# Patient Record
Sex: Female | Born: 1945 | Race: White | Hispanic: No | Smoking: Current every day smoker
Health system: Southern US, Community
[De-identification: ages and names within clinical notes are randomized; demographics above are authoritative.]

## PROBLEM LIST (undated history)

## (undated) DIAGNOSIS — I8390 Asymptomatic varicose veins of unspecified lower extremity: Secondary | ICD-10-CM

## (undated) DIAGNOSIS — C801 Malignant (primary) neoplasm, unspecified: Secondary | ICD-10-CM

## (undated) DIAGNOSIS — M810 Age-related osteoporosis without current pathological fracture: Secondary | ICD-10-CM

## (undated) HISTORY — PX: ENDOSCOPIC LIGATION SAPHENOUS VEIN: SUR441

## (undated) HISTORY — PX: CATARACT EXTRACTION W/ INTRAOCULAR LENS  IMPLANT, BILATERAL: SHX1307

## (undated) HISTORY — PX: DILATION AND CURETTAGE OF UTERUS: SHX78

## (undated) HISTORY — PX: BASAL CELL CARCINOMA EXCISION: SHX1214

## (undated) HISTORY — PX: ABDOMINAL HYSTERECTOMY: SHX81

## (undated) HISTORY — PX: TUBAL LIGATION: SHX77

## (undated) HISTORY — PX: CHOLECYSTECTOMY: SHX55

---

## 2010-05-31 ENCOUNTER — Ambulatory Visit: Payer: Self-pay | Admitting: Ophthalmology

## 2012-02-27 ENCOUNTER — Ambulatory Visit: Payer: Self-pay | Admitting: Family Medicine

## 2015-01-03 ENCOUNTER — Encounter: Payer: Self-pay | Admitting: *Deleted

## 2015-01-05 ENCOUNTER — Encounter: Payer: Self-pay | Admitting: *Deleted

## 2015-07-18 ENCOUNTER — Encounter: Payer: Self-pay | Admitting: *Deleted

## 2015-07-21 ENCOUNTER — Ambulatory Visit: Payer: Medicare Other | Admitting: Anesthesiology

## 2015-07-21 ENCOUNTER — Ambulatory Visit
Admission: RE | Admit: 2015-07-21 | Discharge: 2015-07-21 | Disposition: A | Discharge status: Home / Self Care | Payer: Medicare Other | Source: Ambulatory Visit | Attending: Surgery | Admitting: Surgery

## 2015-07-21 ENCOUNTER — Encounter
Admission: RE | Disposition: A | Discharge status: Home / Self Care | Payer: Self-pay | Source: Ambulatory Visit | Attending: Surgery

## 2015-07-21 DIAGNOSIS — Z8379 Family history of other diseases of the digestive system: Secondary | ICD-10-CM | POA: Diagnosis not present

## 2015-07-21 DIAGNOSIS — Z85828 Personal history of other malignant neoplasm of skin: Secondary | ICD-10-CM | POA: Diagnosis not present

## 2015-07-21 DIAGNOSIS — M810 Age-related osteoporosis without current pathological fracture: Secondary | ICD-10-CM | POA: Insufficient documentation

## 2015-07-21 DIAGNOSIS — Z9049 Acquired absence of other specified parts of digestive tract: Secondary | ICD-10-CM | POA: Insufficient documentation

## 2015-07-21 DIAGNOSIS — Z90710 Acquired absence of both cervix and uterus: Secondary | ICD-10-CM | POA: Insufficient documentation

## 2015-07-21 DIAGNOSIS — F17200 Nicotine dependence, unspecified, uncomplicated: Secondary | ICD-10-CM | POA: Insufficient documentation

## 2015-07-21 DIAGNOSIS — Z8249 Family history of ischemic heart disease and other diseases of the circulatory system: Secondary | ICD-10-CM | POA: Insufficient documentation

## 2015-07-21 DIAGNOSIS — Z9841 Cataract extraction status, right eye: Secondary | ICD-10-CM | POA: Diagnosis not present

## 2015-07-21 DIAGNOSIS — M65342 Trigger finger, left ring finger: Secondary | ICD-10-CM | POA: Diagnosis not present

## 2015-07-21 HISTORY — PX: TRIGGER FINGER RELEASE: SHX641

## 2015-07-21 HISTORY — DX: Asymptomatic varicose veins of unspecified lower extremity: I83.90

## 2015-07-21 SURGERY — RELEASE, A1 PULLEY, FOR TRIGGER FINGER
Anesthesia: Monitor Anesthesia Care | Laterality: Left | Wound class: Clean

## 2015-07-21 MED ORDER — LACTATED RINGERS IV SOLN
INTRAVENOUS | Status: DC
  Administered 2015-07-21: 08:00:00 via INTRAVENOUS

## 2015-07-21 MED ORDER — MIDAZOLAM HCL 5 MG/5ML IJ SOLN
INTRAMUSCULAR | Status: DC | PRN
  Administered 2015-07-21 (×2): 1 mg via INTRAVENOUS

## 2015-07-21 MED ORDER — PROPOFOL 500 MG/50ML IV EMUL
INTRAVENOUS | Status: DC | PRN
  Administered 2015-07-21: 75 ug/kg/min via INTRAVENOUS

## 2015-07-21 MED ORDER — BUPIVACAINE HCL (PF) 0.5 % IJ SOLN
INTRAMUSCULAR | Status: DC | PRN
Start: 2015-07-21 — End: 2015-07-21
  Administered 2015-07-21: 5 mL

## 2015-07-21 MED ORDER — FENTANYL CITRATE (PF) 100 MCG/2ML IJ SOLN
25.0000 ug | INTRAMUSCULAR | Status: DC | PRN
  Administered 2015-07-21: 50 ug via INTRAVENOUS

## 2015-07-21 MED ORDER — CEFAZOLIN SODIUM-DEXTROSE 2-3 GM-% IV SOLR
2.0000 g | Freq: Once | INTRAVENOUS | Status: AC
  Administered 2015-07-21: 2 g via INTRAVENOUS

## 2015-07-21 MED ORDER — HYDROCODONE-ACETAMINOPHEN 5-325 MG PO TABS: 1.0000 | ORAL_TABLET | Freq: Four times a day (QID) | ORAL | Status: DC | PRN

## 2015-07-21 MED ORDER — ONDANSETRON HCL 4 MG/2ML IJ SOLN
4.0000 mg | Freq: Once | INTRAMUSCULAR | Status: AC | PRN
  Administered 2015-07-21: 4 mg via INTRAVENOUS

## 2015-07-21 SURGICAL SUPPLY — 22 items
BANDAGE ELASTIC 2 VELCRO NS LF (GAUZE/BANDAGES/DRESSINGS) IMPLANT
BNDG ESMARK 4X12 TAN STRL LF (GAUZE/BANDAGES/DRESSINGS) ×2 IMPLANT
CHLORAPREP W/TINT 26ML (MISCELLANEOUS) ×2 IMPLANT
CORD BIP STRL DISP 12FT (MISCELLANEOUS) IMPLANT
COVER LIGHT HANDLE FLEXIBLE (MISCELLANEOUS) ×2 IMPLANT
CUFF TOURNIQUET DUAL PORT 18X3 (MISCELLANEOUS) ×4 IMPLANT
DECANTER SPIKE VIAL GLASS SM (MISCELLANEOUS) ×2 IMPLANT
GAUZE PETRO XEROFOAM 1X8 (MISCELLANEOUS) ×2 IMPLANT
GAUZE SPONGE 4X4 12PLY STRL (GAUZE/BANDAGES/DRESSINGS) ×2 IMPLANT
GLOVE BIO SURGEON STRL SZ8 (GLOVE) ×4 IMPLANT
GLOVE INDICATOR 8.0 STRL GRN (GLOVE) ×2 IMPLANT
GOWN STRL REUS W/ TWL LRG LVL3 (GOWN DISPOSABLE) ×1 IMPLANT
GOWN STRL REUS W/ TWL XL LVL3 (GOWN DISPOSABLE) ×1 IMPLANT
GOWN STRL REUS W/TWL LRG LVL3 (GOWN DISPOSABLE) ×1
GOWN STRL REUS W/TWL XL LVL3 (GOWN DISPOSABLE) ×1
NS IRRIG 500ML POUR BTL (IV SOLUTION) ×2 IMPLANT
PACK EXTREMITY ARMC (MISCELLANEOUS) ×2 IMPLANT
PAD GROUND ADULT SPLIT (MISCELLANEOUS) ×2 IMPLANT
STRAP BODY AND KNEE 60X3 (MISCELLANEOUS) ×2 IMPLANT
SUT PROLENE 4 0 PS 2 18 (SUTURE) ×2 IMPLANT
SUT VIC AB 3-0 SH 27 (SUTURE) ×1
SUT VIC AB 3-0 SH 27X BRD (SUTURE) ×1 IMPLANT

## 2015-07-21 NOTE — Transfer of Care (Signed)
Immediate Anesthesia Transfer of Care Note  Patient: Christine Fleming  Procedure(s) Performed: Procedure(s): RELEASE TRIGGER FINGER/A-1 PULLEY (Left)  Patient Location: PACU  Anesthesia Type: Bier Block, MAC  Level of Consciousness: awake, alert  and patient cooperative  Airway and Oxygen Therapy: Patient Spontanous Breathing and Patient connected to supplemental oxygen  Post-op Assessment: Post-op Vital signs reviewed, Patient's Cardiovascular Status Stable, Respiratory Function Stable, Patent Airway and No signs of Nausea or vomiting  Post-op Vital Signs: Reviewed and stable  Complications: No apparent anesthesia complications

## 2015-07-21 NOTE — Anesthesia Preprocedure Evaluation (Signed)
Anesthesia Evaluation  Patient identified by MRN, date of birth, ID band  Reviewed: Allergy & Precautions, H&P , NPO status , Patient's Chart, lab work & pertinent test results  Airway Mallampati: II  TM Distance: >3 FB Neck ROM: full    Dental no notable dental hx.    Pulmonary Current Smoker,    Pulmonary exam normal        Cardiovascular  Rhythm:regular Rate:Normal     Neuro/Psych    GI/Hepatic   Endo/Other    Renal/GU      Musculoskeletal   Abdominal   Peds  Hematology   Anesthesia Other Findings   Reproductive/Obstetrics                             Anesthesia Physical Anesthesia Plan  ASA: I  Anesthesia Plan: Bier Block and MAC   Post-op Pain Management:    Induction:   Airway Management Planned:   Additional Equipment:   Intra-op Plan:   Post-operative Plan:   Informed Consent: I have reviewed the patients History and Physical, chart, labs and discussed the procedure including the risks, benefits and alternatives for the proposed anesthesia with the patient or authorized representative who has indicated his/her understanding and acceptance.     Plan Discussed with: CRNA  Anesthesia Plan Comments:         Anesthesia Quick Evaluation

## 2015-07-21 NOTE — Op Note (Signed)
07/21/2015  8:13 AM  Patient:   Christine Fleming  Pre-Op Diagnosis:   Left ring trigger finger.  Post-Op Diagnosis:   Same  Procedure:   Release left ring trigger finger.  Surgeon:   Pascal Lux, MD  Assistant:   None  Anesthesia:   Bier block  Findings:   As above.  Complications:   None  EBL:   1 cc  Fluids:   450 cc crystalloid  TT:   27 minutes at 250 mmHg  Drains:   None  Closure:   4-0 Prolene  Implants:   None  Brief Clinical Note:   The patient is a 69 year old female with a several month history of progressively worsening painful catching of her left ring finger. These symptoms have progressed despite medications, activity modification, etc. The patient's history and examination were consistent with a left ring trigger finger. She presents this time for a left ring trigger finger release.  Procedure:   The patient was brought into the operating room and lain in the supine position. After adequate IV sedation was achieved, a timeout was performed to verify the appropriate surgical site. A Bier block was placed by the anesthesiologist and the tourniquet inflated to 250 mmHg. The left hand and upper extremity were prepped with ChloraPrep solution before being draped sterilely. Preoperative antibiotics were administered. An approximate 1.5 cm incision was made over the volar aspect of the left ring finger at the distal palmar crease centered over the flexor sheath. The incision was carried down through the subcutaneous tissues with care taken to identify and protect the digital neurovascular structures. The flexor sheath was entered just proximal to the A1 pulley. The sheath was released proximally for several centimeters under direct visualization. Distally, a clamp was placed beneath the A1 pulley and used to release any adhesions. The clamp was repositioned so that one jaw was superficial to and the other jaw deep to the A1 pulley. The A1 pulley was incised on  either side of the clamp to remove a 2 mm strip of tissue. Metzenbaum scissors was used to ensure complete release of the A1 pulley more distally. The underlying tendons were carefully inspected and found to be intact. The wound was copiously irrigated with sterile saline solution before the wound was closed using 4-0 proline interrupted sutures. A total of 5 cc of 0.5% plain Sensorcaine was injected in and around the incision to help with postoperative analgesia before a sterile bulky dressing was applied to the hand. The patient was then awakened and returned to the recovery room in satisfactory condition after tolerating the procedure well.

## 2015-07-21 NOTE — Discharge Instructions (Signed)
General Anesthesia, Adult, Care After Refer to this sheet in the next few weeks. These instructions provide you with information on caring for yourself after your procedure. Your health care provider may also give you more specific instructions. Your treatment has been planned according to current medical practices, but problems sometimes occur. Call your health care provider if you have any problems or questions after your procedure. WHAT TO EXPECT AFTER THE PROCEDURE After the procedure, it is typical to experience:  Sleepiness.  Nausea and vomiting. HOME CARE INSTRUCTIONS  For the first 24 hours after general anesthesia:  Have a responsible person with you.  Do not drive a car. If you are alone, do not take public transportation.  Do not drink alcohol.  Do not take medicine that has not been prescribed by your health care provider.  Do not sign important papers or make important decisions.  You may resume a normal diet and activities as directed by your health care provider.  Change bandages (dressings) as directed.  If you have questions or problems that seem related to general anesthesia, call the hospital and ask for the anesthetist or anesthesiologist on call. SEEK MEDICAL CARE IF:  You have nausea and vomiting that continue the day after anesthesia.  You develop a rash. SEEK IMMEDIATE MEDICAL CARE IF:   You have difficulty breathing.  You have chest pain.  You have any allergic problems.   This information is not intended to replace advice given to you by your health care provider. Make sure you discuss any questions you have with your health care provider.   Document Released: 12/17/2000 Document Revised: 10/01/2014 Document Reviewed: 01/09/2012 Elsevier Interactive Patient Education 2016 [employer].  Keep dressing dry and intact. May shower once dressing changed on Monday morning.  Cover stitches with Band-Aid. Keep hand elevated above heart level. Apply  ice to affected area frequently. Return for follow-up in 10-14 days or as scheduled.

## 2015-07-21 NOTE — H&P (Signed)
Paper H&P to be scanned into permanent record. H&P reviewed. No changes. 

## 2015-07-21 NOTE — Anesthesia Procedure Notes (Addendum)
Procedure Name: MAC Performed by: Mayme Genta Pre-anesthesia Checklist: Patient identified, Emergency Drugs available, Suction available, Timeout performed and Patient being monitored Patient Re-evaluated:Patient Re-evaluated prior to inductionOxygen Delivery Method: Nasal cannula Placement Confirmation: positive ETCO2   Anesthesia Regional Block:  Bier block (IV Regional)  Pre-Anesthetic Checklist: ,, timeout performed, Correct Patient, Correct Site, Correct Laterality, Correct Procedure, Correct Position, site marked, Risks and benefits discussed,  Surgical consent,  Pre-op evaluation,  At surgeon's request and post-op pain management  Laterality: Left  Prep: alcohol swabs       Bier block (IV Regional) Narrative:  Start time: 07/21/2015 7:41 AM End time: 07/21/2015 7:42 AM Injection made incrementally with aspirations every 5 mL.  Performed by: With CRNAs  Anesthesiologist: Ronelle Nigh  Additional Notes: 53ml 0.5% plain Lidocaine injected by Dr. Junious Dresser without difficulty.

## 2015-07-21 NOTE — Anesthesia Postprocedure Evaluation (Signed)
  Anesthesia Post-op Note  Patient: Christine Fleming  Procedure(s) Performed: Procedure(s): RELEASE TRIGGER FINGER/A-1 PULLEY (Left)  Anesthesia type:Bier Block, MAC  Patient location: PACU  Post pain: Pain level controlled  Post assessment: Post-op Vital signs reviewed, Patient's Cardiovascular Status Stable, Respiratory Function Stable, Patent Airway and No signs of Nausea or vomiting  Post vital signs: Reviewed and stable  Last Vitals:  Filed Vitals:   07/21/15 0838  BP:   Pulse: 66  Temp:   Resp: 13    Level of consciousness: awake, alert  and patient cooperative  Complications: No apparent anesthesia complications

## 2015-07-22 ENCOUNTER — Encounter: Payer: Self-pay | Admitting: Surgery

## 2016-01-02 ENCOUNTER — Other Ambulatory Visit: Payer: Self-pay | Admitting: Family Medicine

## 2016-01-02 DIAGNOSIS — M810 Age-related osteoporosis without current pathological fracture: Secondary | ICD-10-CM

## 2016-01-02 DIAGNOSIS — Z1231 Encounter for screening mammogram for malignant neoplasm of breast: Secondary | ICD-10-CM

## 2016-01-10 ENCOUNTER — Ambulatory Visit
Admission: RE | Admit: 2016-01-10 | Discharge: 2016-01-10 | Disposition: A | Discharge status: Home / Self Care | Payer: Medicare Other | Source: Ambulatory Visit | Attending: Family Medicine | Admitting: Family Medicine

## 2016-01-10 DIAGNOSIS — Z1231 Encounter for screening mammogram for malignant neoplasm of breast: Secondary | ICD-10-CM | POA: Diagnosis not present

## 2016-01-10 DIAGNOSIS — Z780 Asymptomatic menopausal state: Secondary | ICD-10-CM | POA: Insufficient documentation

## 2016-01-10 DIAGNOSIS — M810 Age-related osteoporosis without current pathological fracture: Secondary | ICD-10-CM

## 2016-01-10 DIAGNOSIS — Z720 Tobacco use: Secondary | ICD-10-CM | POA: Diagnosis not present

## 2016-01-10 HISTORY — DX: Malignant (primary) neoplasm, unspecified: C80.1

## 2016-03-23 ENCOUNTER — Other Ambulatory Visit: Payer: Self-pay | Admitting: Gastroenterology

## 2016-03-23 MED ORDER — MECLIZINE HCL 32 MG PO TABS: 32.0000 mg | ORAL_TABLET | Freq: Three times a day (TID) | ORAL | Status: DC | PRN

## 2017-09-05 ENCOUNTER — Other Ambulatory Visit: Payer: Self-pay | Admitting: Family Medicine

## 2017-11-15 ENCOUNTER — Encounter: Payer: Self-pay | Admitting: Emergency Medicine

## 2017-11-15 ENCOUNTER — Ambulatory Visit
Admission: EM | Admit: 2017-11-15 | Discharge: 2017-11-15 | Disposition: A | Discharge status: Home / Self Care | Payer: Medicare Other | Attending: Family Medicine | Admitting: Family Medicine

## 2017-11-15 ENCOUNTER — Other Ambulatory Visit: Payer: Self-pay

## 2017-11-15 DIAGNOSIS — R509 Fever, unspecified: Secondary | ICD-10-CM

## 2017-11-15 DIAGNOSIS — J988 Other specified respiratory disorders: Secondary | ICD-10-CM

## 2017-11-15 DIAGNOSIS — R05 Cough: Secondary | ICD-10-CM

## 2017-11-15 HISTORY — DX: Age-related osteoporosis without current pathological fracture: M81.0

## 2017-11-15 MED ORDER — DOXYCYCLINE HYCLATE 100 MG PO CAPS: 100.0000 mg | ORAL_CAPSULE | Freq: Two times a day (BID) | ORAL | 0 refills | Status: AC

## 2017-11-15 NOTE — ED Triage Notes (Signed)
Patient in today c/o cough, body aches, fever (100) and headache x 1 week. Patient states one day she will feel better and then the next day she will feel bad again. Patient has tried OTC Mucinex and a sinus decongestant.

## 2017-11-15 NOTE — Discharge Instructions (Signed)
Rest, fluids. ° °Antibiotic as prescribed. ° °Take care ° °Dr. Ailsa Mireles  °

## 2017-11-15 NOTE — ED Provider Notes (Signed)
[facility]    CSN: [id number] Arrival date & time: 11/15/17  4098  History   Chief Complaint Chief Complaint  Patient presents with  . Cough    APPT   HPI  72 year old female presents with the above complaint.  Patient she has been sick since this past weekend.  She states that she initially had sinus pain and pressure as well as fever and body aches.  Fever resolved and then recurred again recently.  She continues to have sinus pain and pressure.  Mild body aches.  She reports that now she has had a dry cough as well.  No known exacerbating relieving factors.  Symptoms are severe and worsening.  No other associated symptoms.  No other complaints at this time.  Past Medical History:  Diagnosis Date  . Cancer ([place])    basal cell  . Osteoporosis   . Varicose veins    Past Surgical History:  Procedure Laterality Date  . ABDOMINAL HYSTERECTOMY    . BASAL CELL CARCINOMA EXCISION Left    leg  . CATARACT EXTRACTION W/ INTRAOCULAR LENS  IMPLANT, BILATERAL    . CHOLECYSTECTOMY    . DILATION AND CURETTAGE OF UTERUS    . ENDOSCOPIC LIGATION SAPHENOUS VEIN Left   . TRIGGER FINGER RELEASE Left 07/21/2015   Procedure: RELEASE TRIGGER FINGER/A-1 PULLEY;  Surgeon: Corky Mull, MD;  Location: [facility];  Service: Orthopedics;  Laterality: Left;  . TUBAL LIGATION      OB History    No data available       Home Medications    Prior to Admission medications   Medication Sig Start Date End Date Taking? Authorizing Provider  alendronate (FOSAMAX) 70 MG tablet Take 70 mg by mouth once a week. Take with a full glass of water on an empty stomach.   Yes [provider]  Triamcinolone Acetonide (NASACORT ALLERGY 24HR NA) Place 1 spray into the nose daily.   Yes [provider]  doxycycline (VIBRAMYCIN) 100 MG capsule Take 1 capsule (100 mg total) by mouth 2 (two) times daily. 11/15/17   Coral Spikes, DO    Family History Family History  Problem  Relation Age of Onset  . Stroke Mother   . Gallstones Father     Social History Social History   Tobacco Use  . Smoking status: Current Every Day Smoker    Packs/day: 0.50    Years: 40.00    Pack years: 20.00  . Smokeless tobacco: Never Used  Substance Use Topics  . Alcohol use: Yes    Alcohol/week: 2.4 oz    Types: 4 Cans of beer per week  . Drug use: Not on file     Allergies   Patient has no known allergies.   Review of Systems Review of Systems  Constitutional: Positive for fever.  HENT: Positive for sinus pressure and sinus pain.   Respiratory: Positive for cough.   Musculoskeletal:       Body aches.   Physical Exam Triage Vital Signs ED Triage Vitals  Enc Vitals Group     BP 11/15/17 1003 122/68     Pulse --      Resp 11/15/17 1003 16     Temp 11/15/17 1003 97.7 F (36.5 C)     Temp Source 11/15/17 1003 Oral     SpO2 11/15/17 1003 100 %     Weight 11/15/17 1004 124 lb (56.2 kg)     Height 11/15/17 1004 5\' 1"  (  1.549 m)     Head Circumference --      Peak Flow --      Pain Score 11/15/17 1004 0     Pain Loc --      Pain Edu? --      Excl. in [place]? --    Updated Vital Signs BP 122/68 (BP Location: Left Arm)   Temp 97.7 F (36.5 C) (Oral)   Resp 16   Ht 5\' 1"  (1.549 m)   Wt 124 lb (56.2 kg)   SpO2 100%   BMI 23.43 kg/m    Physical Exam  Constitutional: She is oriented to person, place, and time. She appears well-developed. No distress.  HENT:  Head: Normocephalic and atraumatic.  Mouth/Throat: Oropharynx is clear and moist.  Eyes: Conjunctivae are normal. Right eye exhibits no discharge. Left eye exhibits no discharge.  Neck: Neck supple.  Cardiovascular:  Tachycardic. Regular rhythm.  Pulmonary/Chest: Effort normal and breath sounds normal. She has no wheezes. She has no rales.  Lymphadenopathy:    She has no cervical adenopathy.  Neurological: She is alert and oriented to person, place, and time.  Psychiatric: She has a normal mood and  affect. Her behavior is normal.  Nursing note and vitals reviewed.  UC Treatments / Results  Labs (all labs ordered are listed, but only abnormal results are displayed) Labs Reviewed - No data to display  EKG  EKG Interpretation None       Radiology No results found.  Procedures Procedures (including critical care time)  Medications Ordered in UC Medications - No data to display   Initial Impression / Assessment and Plan / UC Course  I have reviewed the triage vital signs and the nursing notes.  Pertinent labs & imaging results that were available during my care of the patient were reviewed by me and considered in my medical decision making (see chart for details).     72 year old female presents with an ongoing respiratory infection.  Concern for recent influenza given symptoms.  However, she is out of the treatment window.  Concern for worsening and double sickening.  As a result, I am will treat with doxycycline.  Final Clinical Impressions(s) / UC Diagnoses   Final diagnoses:  Respiratory infection    ED Discharge Orders        Ordered    doxycycline (VIBRAMYCIN) 100 MG capsule  2 times daily     11/15/17 1117     Controlled Substance Prescriptions [place] Controlled Substance Registry consulted? Not Applicable   Coral Spikes, DO 11/15/17 1154

## 2018-03-24 ENCOUNTER — Other Ambulatory Visit: Payer: Self-pay | Admitting: Family Medicine

## 2018-03-24 DIAGNOSIS — M810 Age-related osteoporosis without current pathological fracture: Secondary | ICD-10-CM

## 2018-03-24 DIAGNOSIS — Z1239 Encounter for other screening for malignant neoplasm of breast: Secondary | ICD-10-CM

## 2018-04-07 ENCOUNTER — Ambulatory Visit
Admission: RE | Admit: 2018-04-07 | Discharge: 2018-04-07 | Disposition: A | Discharge status: Home / Self Care | Payer: Medicare Other | Source: Ambulatory Visit | Attending: Family Medicine | Admitting: Family Medicine

## 2018-04-07 DIAGNOSIS — Z1231 Encounter for screening mammogram for malignant neoplasm of breast: Secondary | ICD-10-CM | POA: Diagnosis present

## 2018-04-07 DIAGNOSIS — Z1239 Encounter for other screening for malignant neoplasm of breast: Secondary | ICD-10-CM

## 2018-04-07 DIAGNOSIS — M810 Age-related osteoporosis without current pathological fracture: Secondary | ICD-10-CM | POA: Diagnosis not present

## 2019-02-04 IMAGING — MG MM DIGITAL SCREENING BILAT W/ TOMO W/ CAD
8 series · 9 of 24 positions shown · non-contrast
Comparison: Previous exam(s).

CLINICAL DATA: Screening.

EXAM:
DIGITAL SCREENING BILATERAL MAMMOGRAM WITH TOMO AND CAD

[L MLO synth-2D]
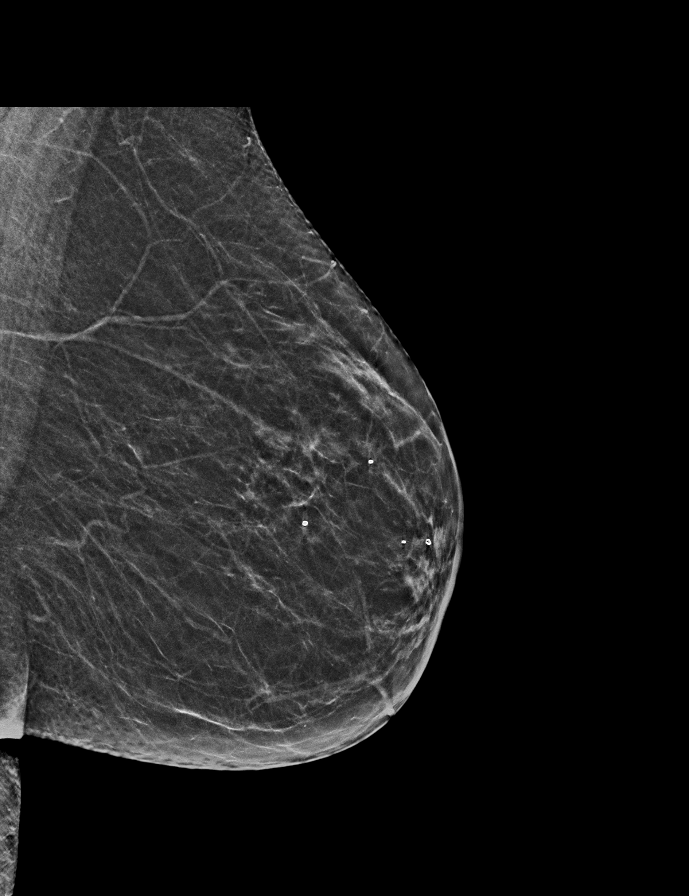

[R MLO synth-2D]
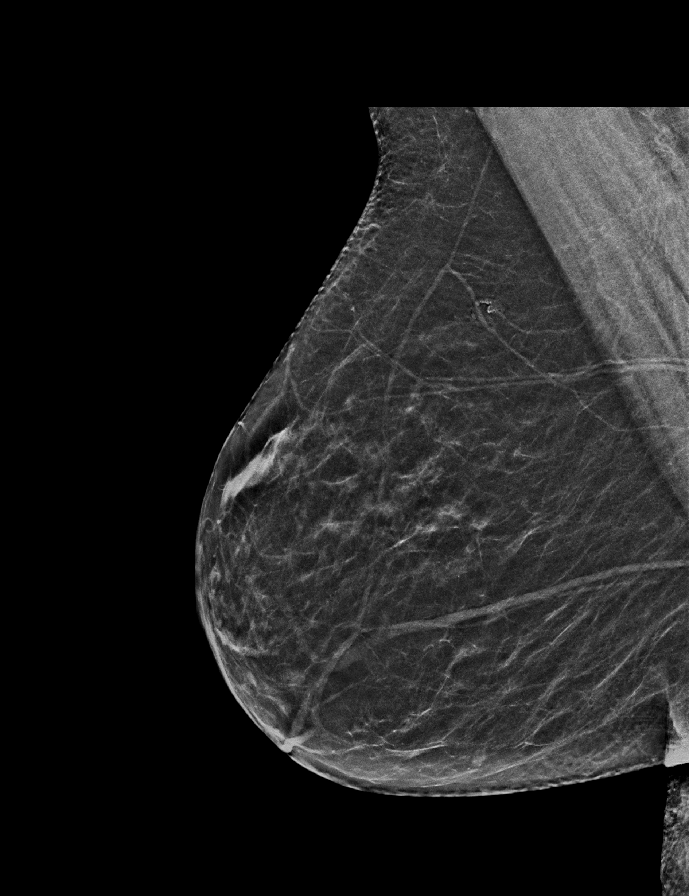

[R CC synth-2D]
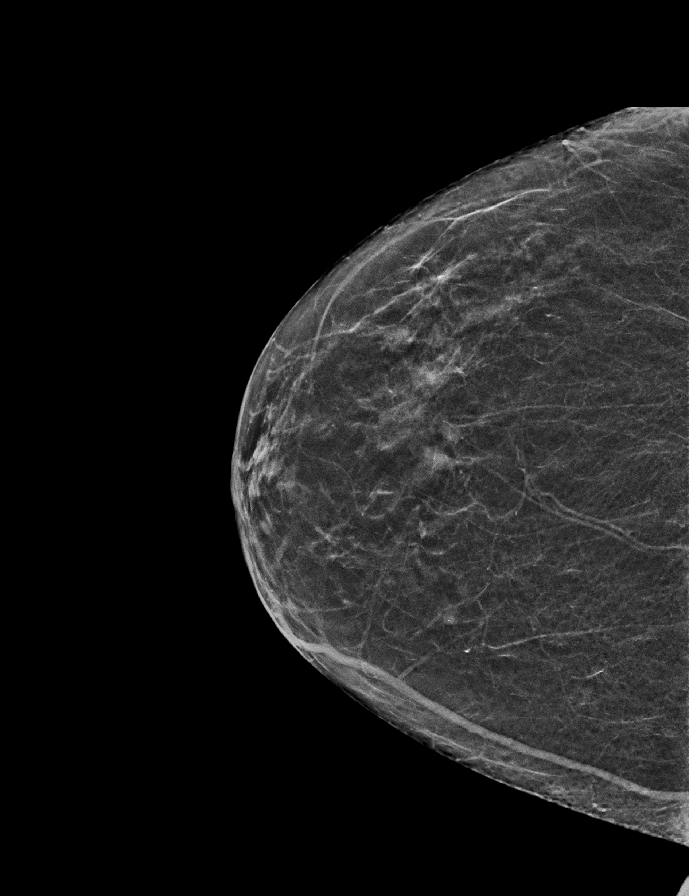

[L CC synth-2D]
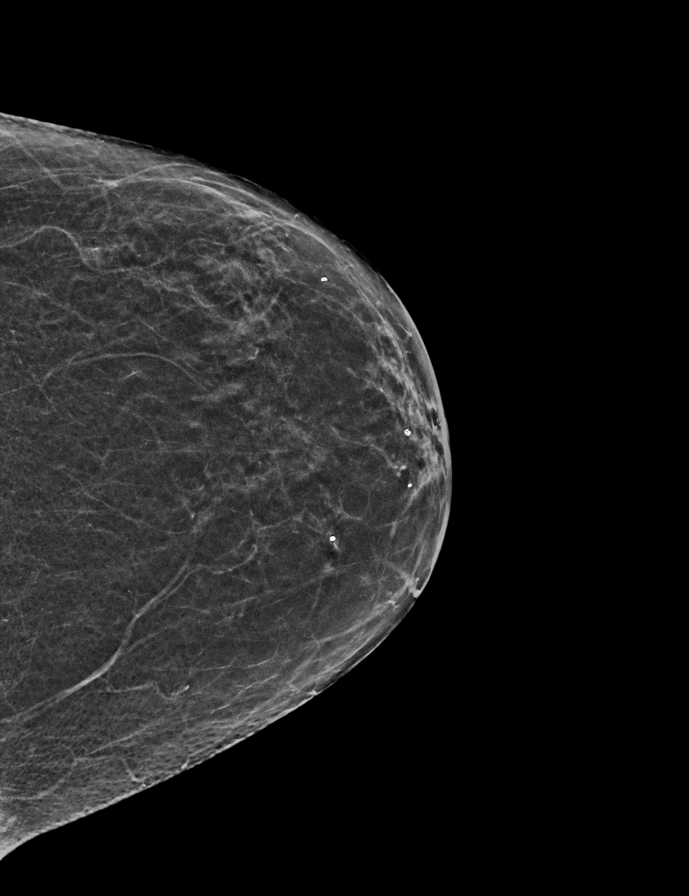

[L CC tomo · 2 of 47 frames shown]
[frame 16/47]
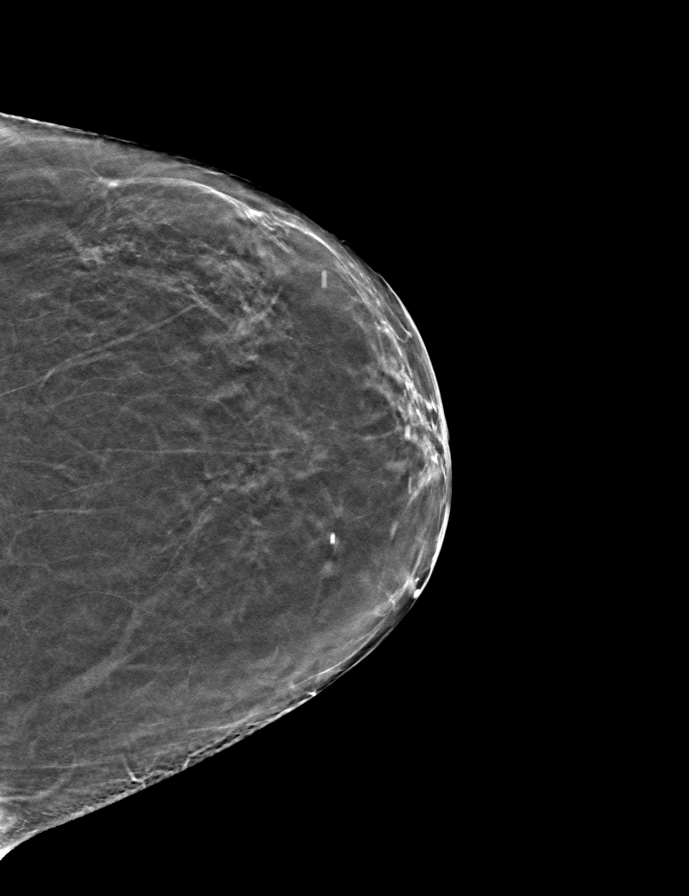
[frame 24/47]
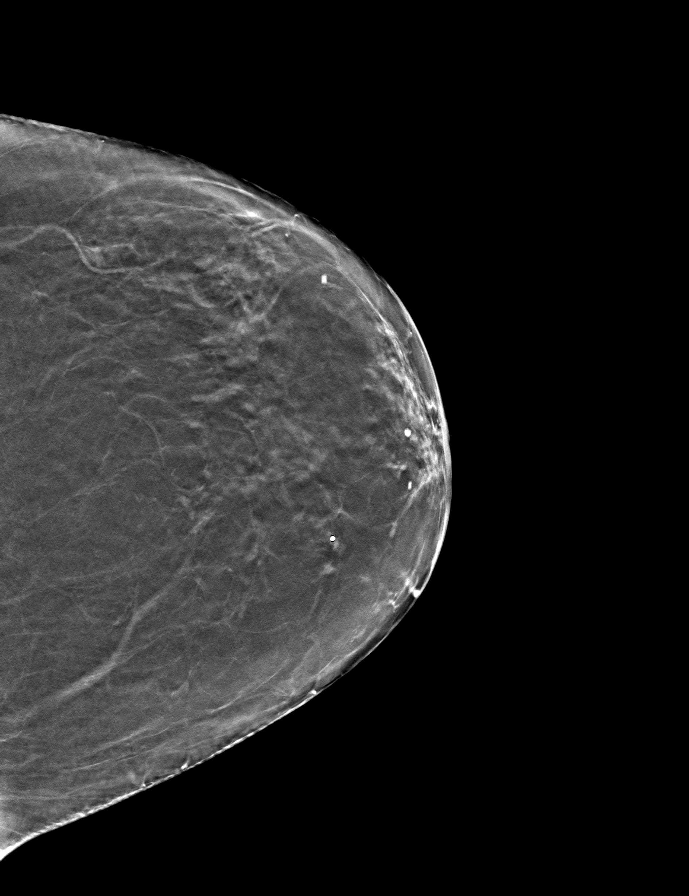

[R MLO tomo · tomo slice 26/51.0]
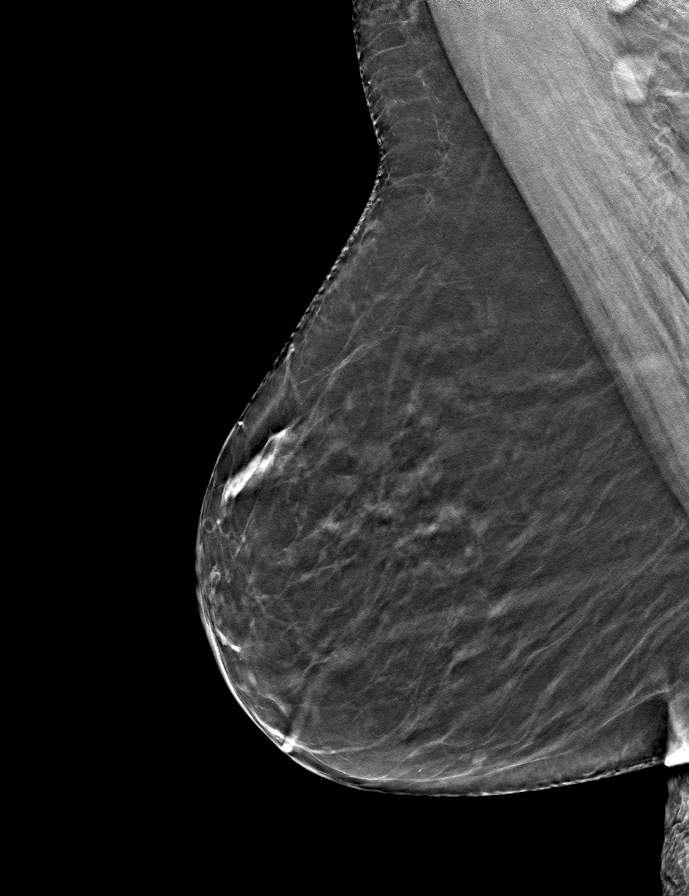

[R CC tomo · tomo slice 23/45.0]
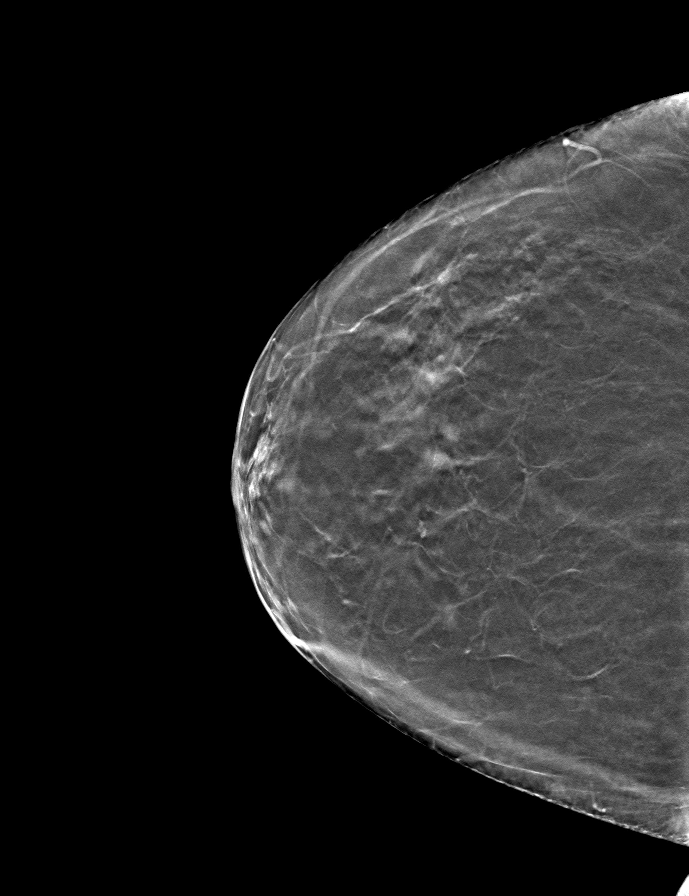

[L MLO tomo · tomo slice 25/49.0]
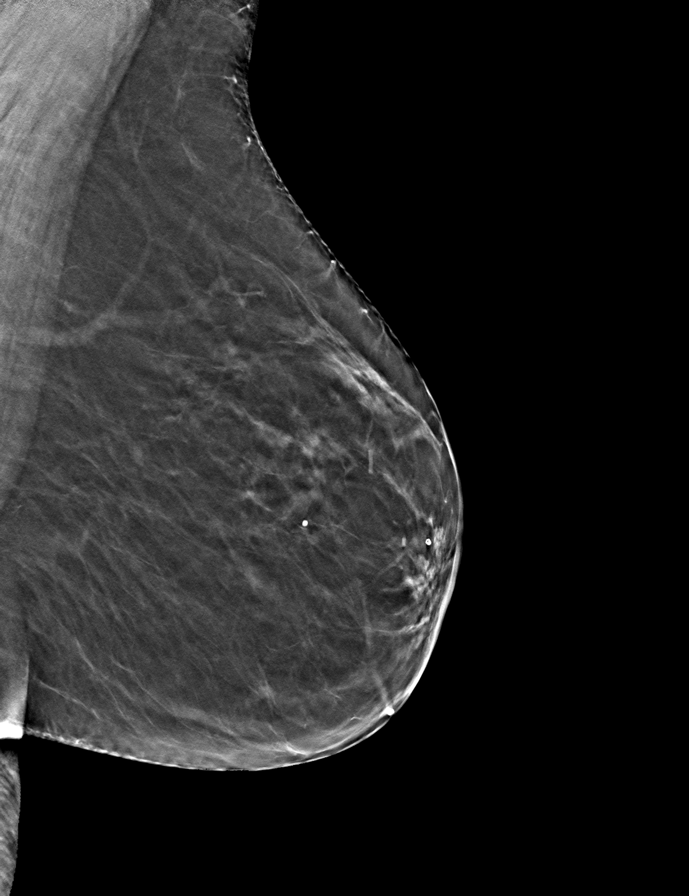

[9 of 24 positions shown; findings below may reference images not displayed]

ACR Breast Density Category b: There are scattered areas of
fibroglandular density.
FINDINGS: There are no findings suspicious for malignancy. Images were
processed with CAD.
IMPRESSION: No mammographic evidence of malignancy. A result letter of this
screening mammogram will be mailed directly to the patient.

RECOMMENDATION:
Screening mammogram in one year. (Code:[id number])

BI-RADS CATEGORY  1: Negative.

## 2019-07-15 ENCOUNTER — Other Ambulatory Visit: Payer: Self-pay | Admitting: Family Medicine

## 2019-07-15 DIAGNOSIS — Z1231 Encounter for screening mammogram for malignant neoplasm of breast: Secondary | ICD-10-CM

## 2019-09-28 ENCOUNTER — Other Ambulatory Visit: Payer: Self-pay

## 2019-09-28 ENCOUNTER — Ambulatory Visit
Admission: RE | Admit: 2019-09-28 | Discharge: 2019-09-28 | Disposition: A | Discharge status: Home / Self Care | Payer: Medicare Other | Source: Ambulatory Visit | Attending: Family Medicine | Admitting: Family Medicine

## 2019-09-28 DIAGNOSIS — Z1231 Encounter for screening mammogram for malignant neoplasm of breast: Secondary | ICD-10-CM | POA: Insufficient documentation

## 2020-08-03 ENCOUNTER — Other Ambulatory Visit: Payer: Self-pay | Admitting: Family Medicine

## 2020-08-03 DIAGNOSIS — Z1231 Encounter for screening mammogram for malignant neoplasm of breast: Secondary | ICD-10-CM

## 2020-08-03 DIAGNOSIS — M810 Age-related osteoporosis without current pathological fracture: Secondary | ICD-10-CM

## 2020-09-28 ENCOUNTER — Ambulatory Visit
Admission: RE | Admit: 2020-09-28 | Discharge: 2020-09-28 | Disposition: A | Discharge status: Home / Self Care | Payer: Medicare Other | Source: Ambulatory Visit | Attending: Family Medicine | Admitting: Family Medicine

## 2020-09-28 ENCOUNTER — Other Ambulatory Visit: Payer: Self-pay

## 2020-09-28 DIAGNOSIS — Z1231 Encounter for screening mammogram for malignant neoplasm of breast: Secondary | ICD-10-CM | POA: Insufficient documentation

## 2020-09-28 DIAGNOSIS — M810 Age-related osteoporosis without current pathological fracture: Secondary | ICD-10-CM

## 2021-07-18 ENCOUNTER — Other Ambulatory Visit: Payer: Self-pay | Admitting: Family Medicine

## 2021-07-18 DIAGNOSIS — Z1231 Encounter for screening mammogram for malignant neoplasm of breast: Secondary | ICD-10-CM

## 2021-10-02 ENCOUNTER — Other Ambulatory Visit: Payer: Self-pay

## 2021-10-02 ENCOUNTER — Ambulatory Visit
Admission: RE | Admit: 2021-10-02 | Discharge: 2021-10-02 | Disposition: A | Discharge status: Home / Self Care | Payer: Medicare Other | Source: Ambulatory Visit | Attending: Family Medicine | Admitting: Family Medicine

## 2021-10-02 DIAGNOSIS — Z1231 Encounter for screening mammogram for malignant neoplasm of breast: Secondary | ICD-10-CM | POA: Diagnosis present

## 2022-02-14 ENCOUNTER — Encounter: Payer: Self-pay | Admitting: Physical Therapy

## 2022-02-14 ENCOUNTER — Ambulatory Visit: Payer: Medicare Other | Attending: Family Medicine | Admitting: Physical Therapy

## 2022-02-14 DIAGNOSIS — Z9181 History of falling: Secondary | ICD-10-CM | POA: Diagnosis present

## 2022-02-14 DIAGNOSIS — R262 Difficulty in walking, not elsewhere classified: Secondary | ICD-10-CM | POA: Diagnosis present

## 2022-02-14 DIAGNOSIS — R102 Pelvic and perineal pain: Secondary | ICD-10-CM | POA: Insufficient documentation

## 2022-02-14 NOTE — Therapy (Signed)
OUTPATIENT PHYSICAL THERAPY FEMALE PELVIC EVALUATION   Patient Name: Christine Fleming MRN: [id number] DOB:September 01, 1946, 76 y.o., female Today's Date: 02/14/2022   PT End of Session - 02/14/22 1322     Visit Number 1    Number of Visits 12    Date for PT Re-Evaluation 03/28/22    Authorization Type Medicare UHC/UMR    PT Start Time 1330    PT Stop Time 1410    PT Time Calculation (min) 40 min    Equipment Utilized During Treatment Gait belt    Activity Tolerance Patient tolerated treatment well    Behavior During Therapy WFL for tasks assessed/performed             Past Medical History:  Diagnosis Date   Cancer ([place])    basal cell   Osteoporosis    Varicose veins    Past Surgical History:  Procedure Laterality Date   ABDOMINAL HYSTERECTOMY     BASAL CELL CARCINOMA EXCISION Left    leg   CATARACT EXTRACTION W/ INTRAOCULAR LENS  IMPLANT, BILATERAL     CHOLECYSTECTOMY     DILATION AND CURETTAGE OF UTERUS     ENDOSCOPIC LIGATION SAPHENOUS VEIN Left    TRIGGER FINGER RELEASE Left 07/21/2015   Procedure: RELEASE TRIGGER FINGER/A-1 PULLEY;  Surgeon: Corky Mull, MD;  Location: [facility];  Service: Orthopedics;  Laterality: Left;   TUBAL LIGATION     There are no problems to display for this patient.   PCP: Gayland Curry, MD  REFERRING PROVIDER: Gayland Curry, MD  REFERRING DIAG: [phone] (ICD-10-CM) - Fracture of superior rim of unspecified pubis, initial encounter for closed fracture   THERAPY DIAG:  Pelvic pain - Plan: PT plan of care cert/re-cert  Difficulty in walking, not elsewhere classified - Plan: PT plan of care cert/re-cert  History of falling - Plan: PT plan of care cert/re-cert  Rationale for Evaluation and Treatment Rehabilitation  PRECAUTIONS: Fall  WEIGHT BEARING RESTRICTIONS Yes WBAT per ED note  FALLS:  Has patient fallen in last 6 months? Yes. Number of falls 1  ONSET DATE: 01/25/2022  SUBJECTIVE:                                                                                                                                                                                            CHIEF COMPLAINT: Patient notes fall almost 3 weeks ago that resulted in pubic ramus fracture on L. Patient notes that pain is changing; about 3 days ago she has not been able to bend over or stoop without assuming L hip extension. Patient notes that initially she was able  to do these tasks without severe pain intensity. Patient notes that her ability to participate in activity has decreased significantly since initial fall and fracture. Patient notes she also now has tailbone pain from decreased activity. Patient notes increased urinary frequency and nocturia x5. Patient is only able to sleep on couch. Patient notes increased swelling in BLE since injury.    PERTINENT HISTORY/CHART REVIEW:  Red flags (bowel/bladder changes, saddle paresthesia, personal history of cancer, h/o spinal tumors, h/o compression fx, h/o abdominal aneurysm, abdominal pain, chills/fever, night sweats, nausea, vomiting, unrelenting pain, first onset of insidious LBP <20 y/o): Negative  Christine Fleming is a 76 y.o. female with PMH of HLD, osteoporosis, and tobacco use presenting for evaluation after a fall. Patient reports sustaining a mechanical fall 2 days ago after she tripped going down 4 steps outside, landing face first onto her hands. No LOC or head strike. No prodromal symptoms. She was able to get up and ambulate right after her fall, however she has since been experiencing persistent L sided groin pain. She has since been able to ambulate with some difficulty 2/2 her pain. She does not ambulate with a cane or walker at baseline. She has been taking Ibuprofen at home that provided some relief of her symptoms, last dose this morning PTA. She is not anticoagulated. She notes no recent tobacco use for the past 3 weeks. She currently lives at home alone. Patient  denies nausea, vomiting, headache, vision changes, numbness or tingling in the extremities, or hematuria.    PAIN:  Are you having pain? Yes NPRS scale:  0/10 (current) 0/10 (least) 10/10 (worst): "moving wrong" groin, R posterior hip Pain location: L groin, R posterior hip (deep soreness) Pain type: intermittent, acute Pain descriptors: sharp (L groin)  Aggravating factors: change of speed Relieving factors: rest  LIVING ENVIRONMENT: Lives with: lives alone Lives in: House/apartment Stairs: Yes: External: 2 steps; none Has following equipment at home: Environmental consultant - 2 wheeled  OCCUPATION/LEISURE ACTIVITIES:  Works at [employer], gardening, dogs  PLOF:  Independent  PATIENT GOALS: Walking without AD, getting pain under control, be able to sit on toilet normal    OBJECTIVE:  DIAGNOSTIC TESTING/IMAGING: "XR Trauma Hip Left  Preliminary Result  Minimally displaced left parasymphyseal / superior pubic ramus fracture.   Question mild widening of the left sacroiliac joint." ED note 01/28/2022  COGNITION:  Patient is oriented to person, place, and time.  Recent memory is intact.  Remote memory is intact.  Attention span and concentration are intact.  Expressive speech is intact.  Patient's fund of knowledge is within normal limits for educational level.    POSTURE/OBSERVATIONS:   Lumbar lordosis: diminished Thoracic kyphosis: increased Iliac crest height: L slightly elevated likely 2/2 to decreased weightbearing Pelvic obliquity: not formally assessed  Leg length discrepancy: not formally assessed    GAIT:  Distance walked: 70 feet Assistive device utilized: Walker - 2 wheeled Level of assistance: Complete Independence Patient ambulates with limited L stance time and decreased L hip flexion, able to normalize with    RANGE OF MOTION: deferred 2/2 to time constraints  AROM (Normal range in degrees) AROM  02/14/2022  Lumbar   Flexion (65) WFL  Extension  (30) WFL  Right lateral flexion (25) WFL  Left lateral flexion (25) WFL  Right rotation (30) WFL  Left rotation (30) WFL      Hip LEFT RIGHT  Flexion (125) WNL   Extension (15) WNL   Abduction (40) WNL  Adduction  WNL   Internal Rotation (45) 50%   External Rotation (45) 50%   (* = pain; blank rows = not tested)   SENSATION:  Grossly intact to light touch bilateral LEs as determined by testing dermatomes L2-S2 Proprioception and hot/cold testing deferred on this date   STRENGTH: MMT    RLE LLE  Hip Flexion 5 5*  Hip Extension    Hip Abduction  5* on L 5  Hip Adduction  3 3  Hip ER  5* on L 5*  Hip IR  5 5*  Knee Extension 5 5  Knee Flexion 5 5  Dorsiflexion  5 5  Plantarflexion (seated)    (* = pain; blank rows = not tested)   MUSCLE LENGTH: deferred 2/2 to time constraints  Hamstrings:  [place] (quadriceps): Thomas (hip flexors):  Ober:   SPECIAL TESTS: further testing deferred 2/2 to time constraints  Stork/March (SP 93): R: Negative L: Negative   PALPATION: deferred 2/2 to time constraints  LOCATION LEFT  RIGHT           Lumbar paraspinals    Quadratus Lumborum    Gluteus Maximus    Gluteus Medius    Deep hip external rotators    PSIS    Fortin's Area (SIJ)    Greater Trochanter    ASIS    Sacral border    Coccyx    Ischial tuberosity    (blank rows = not tested) Graded on 0-4 scale (0 = no pain, 1 = pain, 2 = pain with wincing/grimacing/flinching, 3 = pain with withdrawal, 4 = unwilling to allow palpation)   PHYSICAL PERFORMANCE MEASURES:  5 times sit to stand: 13.09 sec, BUE hands on seat Timed up and go (TUG): deferred to next visit Berg Balance Scale: deferred to next visit Dynamic Gait Index: deferred to next visit   PATIENT EDUCATION:  Patient educated on prognosis, POC, and provided with HEP including: [id number]. Patient articulated understanding and returned demonstration. Patient will benefit from further education in order to  maximize compliance and understanding for long-term therapeutic gains.   PATIENT SURVEYS:  FOTO 16  ASSESSMENT:  Clinical Impression: Patient is a 76 year old presenting to clinic with chief complaints of L sided pelvic pain with associated closed fracture of superior pubic ramus. Today's evaluation is suggestive of deficits in LLE strength, LLE pain, gait, and function as evidenced by use of RW for gait with decreased L stance time, worst pain 10/10 in L groin as well as R posterior hip, gross L hip 4/5 and painful, 5TSTS 13.09 sec with UE support, and decreased tolerance to standing activities. Patient's responses on FOTO outcome measures (16) indicate severe functional limitations/disability/distress. Patient's progress may be limited due to age-related bone density changes; however, patient's motivation and PLOF are advantageous. Patient was able to perform basic HEP including standing hip AROM in all direction during today's evaluation and responded positively to active interventions. Patient will benefit from continued skilled therapeutic intervention to address deficits in LLE strength, LLE pain, gait, and function in order to increase function and improve overall QOL.   Objective impairments: Abnormal gait, decreased balance, decreased coordination, decreased endurance, difficulty walking, decreased ROM, decreased strength, improper body mechanics, postural dysfunction, and pain.   Activity limitations: meal prep, cleaning, laundry, driving, shopping, community activity, and occupation.   Personal factors: Age, Behavior pattern, Time since onset of injury/illness/exacerbation, and 1-2 comorbidities: hyperlipidemia, osteopenia/porosis  are also affecting patient's functional outcome.   Rehab Potential:  Good  Clinical decision making: Evolving/moderate complexity  Evaluation complexity: Moderate   GOALS: Goals reviewed with patient? Yes   LONG TERM GOALS: Target date:  03/28/2022  Patient will demonstrate independence with HEP in order to maximize therapeutic gains and improve carryover from physical therapy sessions to ADLs in the home and community. Baseline: provided Goal status: INITIAL  Patient will demonstrate improved function as evidenced by a score of 54 on FOTO measure for full participation in activities at home and in the community.  Baseline: 16 Goal status: INITIAL  Patient will decrease worst pain as reported on NPRS by at least 2 points to demonstrate clinically significant reduction in pain in order to restore/improve function and overall QOL.  Baseline: 10/10 Goal status: INITIAL  Patient will be able to ambulate without AD on level and unlevel surfaces independently and without limitation or evidence of imbalance in order to return to PLOF. Baseline: RW, level 70 ft., increased WB through UE Goal status: INITIAL  Patient will decrease 5TSTS by at least 3 seconds without UE support nor compensatory patterns in order to demonstrate clinically significant improvement in LE strength. Baseline: 13.09 sec, BUE support on seat Goal status: INITIAL   PLAN: Rehab frequency: 2x/week  Rehab duration: 6 weeks  Planned interventions: Therapeutic exercises, Therapeutic activity, Neuromuscular re-education, Balance training, Gait training, Patient/Family education, Joint mobilization, Orthotic/Fit training, Spinal mobilization, Cryotherapy, Moist heat, and Manual therapy     Myles Gip PT, DPT [phone]  02/14/2022, 5:48 PM

## 2022-02-15 ENCOUNTER — Ambulatory Visit: Payer: Medicare Other | Admitting: Physical Therapy

## 2022-02-20 ENCOUNTER — Ambulatory Visit: Payer: Medicare Other | Admitting: Physical Therapy

## 2022-02-20 ENCOUNTER — Encounter: Payer: Self-pay | Admitting: Physical Therapy

## 2022-02-20 DIAGNOSIS — Z9181 History of falling: Secondary | ICD-10-CM

## 2022-02-20 DIAGNOSIS — R262 Difficulty in walking, not elsewhere classified: Secondary | ICD-10-CM

## 2022-02-20 DIAGNOSIS — R102 Pelvic and perineal pain: Secondary | ICD-10-CM | POA: Diagnosis not present

## 2022-02-20 NOTE — Therapy (Signed)
OUTPATIENT PHYSICAL THERAPY FEMALE PELVIC TREATMENT   Patient Name: Christine Fleming MRN: [id number] DOB:[birth year], 76 y.o., female Today's Date: 02/20/2022   PT End of Session - 02/20/22 1123     Visit Number 2    Number of Visits 12    Date for PT Re-Evaluation 03/28/22    Authorization Type Medicare UHC/UMR    PT Start Time 1120    PT Stop Time 1200    PT Time Calculation (min) 40 min    Equipment Utilized During Treatment Gait belt    Activity Tolerance Patient tolerated treatment well;Patient limited by pain    Behavior During Therapy WFL for tasks assessed/performed             Past Medical History:  Diagnosis Date   Cancer ([place])    basal cell   Osteoporosis    Varicose veins    Past Surgical History:  Procedure Laterality Date   ABDOMINAL HYSTERECTOMY     BASAL CELL CARCINOMA EXCISION Left    leg   CATARACT EXTRACTION W/ INTRAOCULAR LENS  IMPLANT, BILATERAL     CHOLECYSTECTOMY     DILATION AND CURETTAGE OF UTERUS     ENDOSCOPIC LIGATION SAPHENOUS VEIN Left    TRIGGER FINGER RELEASE Left 07/21/2015   Procedure: RELEASE TRIGGER FINGER/A-1 PULLEY;  Surgeon: Corky Mull, MD;  Location: [facility];  Service: Orthopedics;  Laterality: Left;   TUBAL LIGATION     There are no problems to display for this patient.   PCP: Gayland Curry, MD  REFERRING PROVIDER: Gayland Curry, MD  REFERRING DIAG: [phone] (ICD-10-CM) - Fracture of superior rim of unspecified pubis, initial encounter for closed fracture   THERAPY DIAG:  Difficulty in walking, not elsewhere classified  Pelvic pain  History of falling  Rationale for Evaluation and Treatment Rehabilitation  PRECAUTIONS: Fall  WEIGHT BEARING RESTRICTIONS Yes WBAT per ED note  FALLS:  Has patient fallen in last 6 months? Yes. Number of falls 1  ONSET DATE: 01/25/2022  SUBJECTIVE:  Patient reports that she had a rough weekend with pain. Patient was not able to bear weight on RLE for  standing HEP. Patient notes that pain came on with car transfer on return home and stayed elevated all weekend. Patient has been able to bend forward without issue since initial evaluation. Patient notes that she does not have tingling, but rather an ache that feels like she might not be getting good blood flow in RLE.   PERTINENT HISTORY/CHART REVIEW:  Red flags (bowel/bladder changes, saddle paresthesia, personal history of cancer, h/o spinal tumors, h/o compression fx, h/o abdominal aneurysm, abdominal pain, chills/fever, night sweats, nausea, vomiting, unrelenting pain, first onset of insidious LBP <20 y/o): Negative  Christine Fleming is a 76 y.o. female with PMH of HLD, osteoporosis, and tobacco use presenting for evaluation after a fall. Patient reports sustaining a mechanical fall 2 days ago after she tripped going down 4 steps outside, landing face first onto her hands. No LOC or head strike. No prodromal symptoms. She was able to get up and ambulate right after her fall, however she has since been experiencing persistent L sided groin pain. She has since been able to ambulate with some difficulty 2/2 her pain. She does not ambulate with a cane or walker at baseline. She has been taking Ibuprofen at home that provided some relief of her symptoms, last dose this morning PTA. She is not anticoagulated. She notes no recent tobacco use for the past 3  weeks. She currently lives at home alone. Patient denies nausea, vomiting, headache, vision changes, numbness or tingling in the extremities, or hematuria.    PAIN:  Are you having pain? Yes NPRS scale:  1/10 (current)  0/10 (least) 10/10 (worst): R posterior hip (radiating to knee) Pain location: R posterior hip   PLOF:  Independent  PATIENT GOALS: Walking without AD, getting pain under control, be able to sit on toilet normal    OBJECTIVE:  DIAGNOSTIC TESTING/IMAGING: "XR Trauma Hip Left  Preliminary Result  Minimally displaced left  parasymphyseal / superior pubic ramus fracture.   Question mild widening of the left sacroiliac joint." ED note 01/28/2022 with    MUSCLE LENGTH:  Nicoletta Dress: R: Negative L: Negative  PALPATION:  LOCATION LEFT  RIGHT           Lumbar paraspinals    Quadratus Lumborum    Gluteus Maximus  1  Gluteus Medius  2  Deep hip external rotators    PSIS  0  Fortin's Area (SIJ)  0  Greater Trochanter  3  ASIS    Sacral border  2  Coccyx  0  Ischial tuberosity  2  (blank rows = not tested) Graded on 0-4 scale (0 = no pain, 1 = pain, 2 = pain with wincing/grimacing/flinching, 3 = pain with withdrawal, 4 = unwilling to allow palpation)   PHYSICAL PERFORMANCE MEASURES:  5 times sit to stand: 13.09 sec, BUE hands on seat Timed up and go (TUG): deferred to next visit Berg Balance Scale: deferred to next visit Dynamic Gait Index: deferred to next visit   TREATMENT  Pre-treatment assessment:  Manual Therapy:   Neuromuscular Re-education:   Therapeutic Exercise: 15mn WT: 136 ft, RW, SBA Seated hip strengthening:  RTB, hip abduction, x20  Hip adduction isometric, 5 sec hold x20  Treatments unbilled:  Post-treatment assessment:  Patient educated throughout session on appropriate technique and form using multi-modal cueing, HEP, and activity modification. Patient articulated understanding and returned demonstration.  Patient Response to interventions: 5-6/10 at R hip   PATIENT SURVEYS:  FOTO 16  ASSESSMENT:  Clinical Impression: Patient presents to clinic with excellent motivation to participate in therapy. Patient demonstrates deficits in LLE strength, LLE pain, gait, and function. Patient able to achieve seated hip strengthening without increased pain in R hip during today's session and responded positively to active interventions. Patient had point tenderness over greater trochanter on R with moderate pressure causing patient to withdraw from palpation; patient was encouraged  to follow-up with orthopedics to rule out any further injury sustained during recent fall (01/28/2022). Patient will benefit from continued skilled therapeutic intervention to address remaining deficits in LLE strength, LLE pain, gait, and function in order to increase function and improve overall QOL.   Objective impairments: Abnormal gait, decreased balance, decreased coordination, decreased endurance, difficulty walking, decreased ROM, decreased strength, improper body mechanics, postural dysfunction, and pain.   Activity limitations: meal prep, cleaning, laundry, driving, shopping, community activity, and occupation.   Personal factors: Age, Behavior pattern, Time since onset of injury/illness/exacerbation, and 1-2 comorbidities: hyperlipidemia, osteopenia/porosis  are also affecting patient's functional outcome.   Rehab Potential: Good  Clinical decision making: Evolving/moderate complexity  Evaluation complexity: Moderate   GOALS: Goals reviewed with patient? Yes   LONG TERM GOALS: Target date: 04/03/2022  Patient will demonstrate independence with HEP in order to maximize therapeutic gains and improve carryover from physical therapy sessions to ADLs in the home and community. Baseline: provided Goal status: INITIAL  Patient will demonstrate improved function as evidenced by a score of 54 on FOTO measure for full participation in activities at home and in the community.  Baseline: 16 Goal status: INITIAL  Patient will decrease worst pain as reported on NPRS by at least 2 points to demonstrate clinically significant reduction in pain in order to restore/improve function and overall QOL.  Baseline: 10/10 Goal status: INITIAL  Patient will be able to ambulate without AD on level and unlevel surfaces independently and without limitation or evidence of imbalance in order to return to PLOF. Baseline: RW, level 70 ft., increased WB through UE Goal status: INITIAL  Patient will  decrease 5TSTS by at least 3 seconds without UE support nor compensatory patterns in order to demonstrate clinically significant improvement in LE strength. Baseline: 13.09 sec, BUE support on seat Goal status: INITIAL   PLAN: Rehab frequency: 2x/week  Rehab duration: 6 weeks  Planned interventions: Therapeutic exercises, Therapeutic activity, Neuromuscular re-education, Balance training, Gait training, Patient/Family education, Joint mobilization, Orthotic/Fit training, Spinal mobilization, Cryotherapy, Moist heat, and Manual therapy     Myles Gip PT, DPT [phone]  02/20/2022, 5:32 PM

## 2022-02-22 ENCOUNTER — Encounter: Payer: Medicare Other | Admitting: Physical Therapy

## 2022-02-27 ENCOUNTER — Encounter: Payer: Self-pay | Admitting: Physical Therapy

## 2022-02-27 ENCOUNTER — Ambulatory Visit: Payer: Medicare Other | Attending: Family Medicine | Admitting: Physical Therapy

## 2022-02-27 DIAGNOSIS — Z9181 History of falling: Secondary | ICD-10-CM | POA: Insufficient documentation

## 2022-02-27 DIAGNOSIS — R102 Pelvic and perineal pain: Secondary | ICD-10-CM | POA: Diagnosis present

## 2022-02-27 DIAGNOSIS — R262 Difficulty in walking, not elsewhere classified: Secondary | ICD-10-CM | POA: Diagnosis present

## 2022-02-27 NOTE — Therapy (Signed)
OUTPATIENT PHYSICAL THERAPY FEMALE PELVIC TREATMENT   Patient Name: Christine Fleming MRN: [id number] DOB:[birth year], 76 y.o., female Today's Date: 02/27/2022   PT End of Session - 02/27/22 1117     Visit Number 3    Number of Visits 12    Date for PT Re-Evaluation 03/28/22    Authorization Type Medicare UHC/UMR    PT Start Time 1115    PT Stop Time 1155    PT Time Calculation (min) 40 min    Equipment Utilized During Treatment Gait belt    Activity Tolerance Patient tolerated treatment well;Patient limited by pain    Behavior During Therapy WFL for tasks assessed/performed             Past Medical History:  Diagnosis Date   Cancer ([place])    basal cell   Osteoporosis    Varicose veins    Past Surgical History:  Procedure Laterality Date   ABDOMINAL HYSTERECTOMY     BASAL CELL CARCINOMA EXCISION Left    leg   CATARACT EXTRACTION W/ INTRAOCULAR LENS  IMPLANT, BILATERAL     CHOLECYSTECTOMY     DILATION AND CURETTAGE OF UTERUS     ENDOSCOPIC LIGATION SAPHENOUS VEIN Left    TRIGGER FINGER RELEASE Left 07/21/2015   Procedure: RELEASE TRIGGER FINGER/A-1 PULLEY;  Surgeon: Corky Mull, MD;  Location: [facility];  Service: Orthopedics;  Laterality: Left;   TUBAL LIGATION     There are no problems to display for this patient.   PCP: Gayland Curry, MD  REFERRING PROVIDER: Gayland Curry, MD  REFERRING DIAG: [id number] (ICD-10-CM) - Fracture of superior rim of unspecified pubis, initial encounter for closed fracture   THERAPY DIAG:  Difficulty in walking, not elsewhere classified  Pelvic pain  History of falling  Rationale for Evaluation and Treatment Rehabilitation  PRECAUTIONS: Fall  WEIGHT BEARING RESTRICTIONS Yes WBAT per ED note  FALLS:  Has patient fallen in last 6 months? Yes. Number of falls 1  ONSET DATE: 01/25/2022  SUBJECTIVE:  Patient reports that she had a rough weekend with pain. Patient was not able to bear weight on RLE for  standing HEP. Patient notes that pain came on with car transfer on return home and stayed elevated all weekend. Patient has been able to bend forward without issue since initial evaluation. Patient notes that she does not have tingling, but rather an ache that feels like she might not be getting good blood flow in RLE.   PERTINENT HISTORY/CHART REVIEW:  Red flags (bowel/bladder changes, saddle paresthesia, personal history of cancer, h/o spinal tumors, h/o compression fx, h/o abdominal aneurysm, abdominal pain, chills/fever, night sweats, nausea, vomiting, unrelenting pain, first onset of insidious LBP <20 y/o): Negative  Christine Fleming is a 76 y.o. female with PMH of HLD, osteoporosis, and tobacco use presenting for evaluation after a fall. Patient reports sustaining a mechanical fall 2 days ago after she tripped going down 4 steps outside, landing face first onto her hands. No LOC or head strike. No prodromal symptoms. She was able to get up and ambulate right after her fall, however she has since been experiencing persistent L sided groin pain. She has since been able to ambulate with some difficulty 2/2 her pain. She does not ambulate with a cane or walker at baseline. She has been taking Ibuprofen at home that provided some relief of her symptoms, last dose this morning PTA. She is not anticoagulated. She notes no recent tobacco use for the past 3  weeks. She currently lives at home alone. Patient denies nausea, vomiting, headache, vision changes, numbness or tingling in the extremities, or hematuria.    PAIN:  Are you having pain? Yes NPRS scale:  3-4/10 (current); walking in to clinic   PLOF:  Independent  PATIENT GOALS: Walking without AD, getting pain under control, be able to sit on toilet normal    OBJECTIVE:   02/27/2022 TREATMENT  Pre-treatment assessment:  Manual Therapy:   Neuromuscular Re-education: Tandem gait, x2 lengths, CGA, faded UE support Ambulation in  hallway, SPC, moderate cueing for sequencing and to limit WB through UE, SBA  Therapeutic Exercise: NuStep, L3-5, seat 7, x5 min for warm-up BLE strengthening:  2# CW hip flexion march, standing, BUE support x 84mn 2# CW hip abduction, standing, BUE support, x30 sec each leg 2# CW hamstring curel, standing, BUE support, x1 min RTB mini squat to chair, BUE support, 3x10   Treatments unbilled:  Post-treatment assessment:  Patient educated throughout session on appropriate technique and form using multi-modal cueing, HEP, and activity modification. Patient articulated understanding and returned demonstration.  Patient Response to interventions: 2-3/10 at R hip   PATIENT SURVEYS:  FOTO 16  ASSESSMENT:  Clinical Impression: Patient presents to clinic with excellent motivation to participate in therapy. Patient demonstrates deficits in LLE strength, LLE pain, gait, and function. Patient able to tolerate standing B hip strengthening with no increased pain during today's session and responded positively to active interventions. Patient will benefit from continued skilled therapeutic intervention to address remaining deficits in LLE strength, LLE pain, gait, and function in order to increase function and improve overall QOL.   Objective impairments: Abnormal gait, decreased balance, decreased coordination, decreased endurance, difficulty walking, decreased ROM, decreased strength, improper body mechanics, postural dysfunction, and pain.   Activity limitations: meal prep, cleaning, laundry, driving, shopping, community activity, and occupation.   Personal factors: Age, Behavior pattern, Time since onset of injury/illness/exacerbation, and 1-2 comorbidities: hyperlipidemia, osteopenia/porosis  are also affecting patient's functional outcome.   Rehab Potential: Good  Clinical decision making: Evolving/moderate complexity  Evaluation complexity: Moderate   GOALS: Goals reviewed with  patient? Yes   LONG TERM GOALS: Target date: 03/28/2022  Patient will demonstrate independence with HEP in order to maximize therapeutic gains and improve carryover from physical therapy sessions to ADLs in the home and community. Baseline: provided Goal status: INITIAL  Patient will demonstrate improved function as evidenced by a score of 54 on FOTO measure for full participation in activities at home and in the community.  Baseline: 16 Goal status: INITIAL  Patient will decrease worst pain as reported on NPRS by at least 2 points to demonstrate clinically significant reduction in pain in order to restore/improve function and overall QOL.  Baseline: 10/10 Goal status: INITIAL  Patient will be able to ambulate without AD on level and unlevel surfaces independently and without limitation or evidence of imbalance in order to return to PLOF. Baseline: RW, level 70 ft., increased WB through UE Goal status: INITIAL  Patient will decrease 5TSTS by at least 3 seconds without UE support nor compensatory patterns in order to demonstrate clinically significant improvement in LE strength. Baseline: 13.09 sec, BUE support on seat Goal status: INITIAL   PLAN: Rehab frequency: 2x/week  Rehab duration: 6 weeks  Planned interventions: Therapeutic exercises, Therapeutic activity, Neuromuscular re-education, Balance training, Gait training, Patient/Family education, Joint mobilization, Orthotic/Fit training, Spinal mobilization, Cryotherapy, Moist heat, and Manual therapy     KMyles GipPT, DPT #[phone]  02/27/2022, 12:35 PM

## 2022-03-01 ENCOUNTER — Encounter: Payer: Commercial Managed Care - PPO | Admitting: Physical Therapy

## 2022-03-06 ENCOUNTER — Encounter: Payer: Commercial Managed Care - PPO | Admitting: Physical Therapy

## 2022-03-08 ENCOUNTER — Ambulatory Visit: Payer: Medicare Other | Admitting: Physical Therapy

## 2022-03-15 ENCOUNTER — Encounter: Payer: Self-pay | Admitting: Physical Therapy

## 2022-03-16 ENCOUNTER — Other Ambulatory Visit: Payer: Self-pay | Admitting: Physician Assistant

## 2022-03-16 DIAGNOSIS — M25551 Pain in right hip: Secondary | ICD-10-CM

## 2022-03-16 DIAGNOSIS — S32519A Fracture of superior rim of unspecified pubis, initial encounter for closed fracture: Secondary | ICD-10-CM

## 2022-03-28 ENCOUNTER — Ambulatory Visit
Admission: RE | Admit: 2022-03-28 | Discharge: 2022-03-28 | Disposition: A | Discharge status: Home / Self Care | Payer: Medicare Other | Source: Ambulatory Visit | Attending: Physician Assistant | Admitting: Physician Assistant

## 2022-03-28 DIAGNOSIS — S32519A Fracture of superior rim of unspecified pubis, initial encounter for closed fracture: Secondary | ICD-10-CM | POA: Diagnosis present

## 2022-03-28 DIAGNOSIS — M25551 Pain in right hip: Secondary | ICD-10-CM | POA: Insufficient documentation

## 2022-04-11 ENCOUNTER — Other Ambulatory Visit: Payer: Self-pay | Admitting: Family Medicine

## 2022-04-11 DIAGNOSIS — Z780 Asymptomatic menopausal state: Secondary | ICD-10-CM

## 2022-04-11 DIAGNOSIS — S32511D Fracture of superior rim of right pubis, subsequent encounter for fracture with routine healing: Secondary | ICD-10-CM

## 2022-04-11 DIAGNOSIS — M8000XD Age-related osteoporosis with current pathological fracture, unspecified site, subsequent encounter for fracture with routine healing: Secondary | ICD-10-CM

## 2022-05-02 ENCOUNTER — Inpatient Hospital Stay: Admission: RE | Admit: 2022-05-02 | Payer: Commercial Managed Care - PPO | Source: Ambulatory Visit

## 2022-05-17 ENCOUNTER — Encounter: Payer: Self-pay | Admitting: Physical Therapy

## 2022-05-17 ENCOUNTER — Ambulatory Visit: Payer: Medicare Other | Attending: Family Medicine | Admitting: Physical Therapy

## 2022-05-17 DIAGNOSIS — Z9181 History of falling: Secondary | ICD-10-CM | POA: Diagnosis present

## 2022-05-17 DIAGNOSIS — R102 Pelvic and perineal pain: Secondary | ICD-10-CM | POA: Diagnosis present

## 2022-05-17 DIAGNOSIS — R262 Difficulty in walking, not elsewhere classified: Secondary | ICD-10-CM | POA: Insufficient documentation

## 2022-05-17 NOTE — Therapy (Signed)
OUTPATIENT PHYSICAL THERAPY EVALUATION   Patient Name: JOYCELYN Fleming MRN: [id number] DOB:March 13, 1946, 76 y.o., female Today's Date: 05/17/2022   PT End of Session - 05/17/22 1050     Visit Number 1    Number of Visits 17    Date for PT Re-Evaluation 07/12/22    PT Start Time 1030    PT Stop Time 1110    PT Time Calculation (min) 40 min    Equipment Utilized During Treatment Gait belt    Activity Tolerance Patient tolerated treatment well    Behavior During Therapy WFL for tasks assessed/performed             Past Medical History:  Diagnosis Date   Cancer ([place])    basal cell   Osteoporosis    Varicose veins    Past Surgical History:  Procedure Laterality Date   ABDOMINAL HYSTERECTOMY     BASAL CELL CARCINOMA EXCISION Left    leg   CATARACT EXTRACTION W/ INTRAOCULAR LENS  IMPLANT, BILATERAL     CHOLECYSTECTOMY     DILATION AND CURETTAGE OF UTERUS     ENDOSCOPIC LIGATION SAPHENOUS VEIN Left    TRIGGER FINGER RELEASE Left 07/21/2015   Procedure: RELEASE TRIGGER FINGER/A-1 PULLEY;  Surgeon: Corky Mull, MD;  Location: [facility];  Service: Orthopedics;  Laterality: Left;   TUBAL LIGATION     There are no problems to display for this patient.   PCP: Gayland Curry, MD  REFERRING PROVIDER: Gayland Curry, MD  REFERRING DIAGNOSIS: S32.519A (ICD-10-CM) - Fracture of superior rim of unspecified pubis, initial encounter for closed fracture   THERAPY DIAG: Difficulty in walking, not elsewhere classified  Pelvic pain  History of falling  RATIONALE FOR EVALUATION AND TREATMENT: Rehabilitation  ONSET DATE: 04/26/2022 (surgical date)  FOLLOW UP APPT WITH PROVIDER: Yes    SUBJECTIVE:                                                                                                                                                                                         Chief Complaint: Patient returns to clinic after surgical repair of fractures of the  sacrum and in the context of continued slow healing fractures of L pubic ramus. Able to ambulate without RW for household distances. Notes decreased tolerance to activity, pain at sacrum with and without hip movement. Patient is WBAT and denies any specific concerns.   Pertinent History percutaneous skeletal fixation of bilateral sacral fractures by Dr. Lennart Pall on 04/26/2022 that was sustained after a fall. Originally it was treated non-operatively, by the patient has since developed significant kyphotic deformity of the sacrum leading to significant  pain and impaired ambulation. Date of injury is 01/26/2022.   Pain:  Pain Intensity: Present: /10, Best: /10, Worst: /10 Pain location: L groin, B sacrum Pain Quality: sharp, burning, and aching  Radiating: Yes  Numbness/Tingling: No Focal Weakness: No Aggravating factors: hip abduction, rotation, flexion; increased walking, walking on uneven ground Relieving factors: rest, medication  History of prior back injury, pain, surgery, or therapy: Yes Falls: Has patient fallen in last 6 months? Yes, Number of falls: 1 Follow-up appointment with MD: Yes  Imaging: Yes  Prior level of function: Independent  Red flags (bowel/bladder changes, saddle paresthesia, personal history of cancer, h/o spinal tumors, h/o compression fx, h/o abdominal aneurysm, abdominal pain, chills/fever, night sweats, nausea, vomiting, unrelenting pain, first onset of insidious LBP <20 y/o): Negative  Precautions: None  Weight Bearing Restrictions: WBAT   OBJECTIVE:   Patient Surveys  FOTO 48; predicted 45   Cognition Patient is oriented to person, place, and time.  Recent memory is intact.  Remote memory is intact.  Attention span and concentration are intact.  Expressive speech is intact.  Patient's fund of knowledge is within normal limits for educational level.     Gross Musculoskeletal Assessment Tremor: None Bulk: Normal Tone: Normal No visible step-off  along spinal column, no signs of scoliosis   GAIT: Patient ambulates with decreased stance time on LLE and elevated L IC. Patient also has limited load acceptance on LLE, but shows no signs of significant unsteadiness when ambulating without AD.    Posture: Lumbar lordosis: WNL Iliac crest height: L elevated Lumbar lateral shift: Negative Lower crossed syndrome (tight hip flexors and erector spinae; weak gluts and abs): Negative   AROM  AROM (Normal range in degrees) AROM  05/17/2022  Lumbar   Flexion (65)   Extension (30)   Right lateral flexion (25)   Left lateral flexion (25)   Right rotation (30)   Left rotation (30)       Hip Right Left  Flexion (125) WNL WNL*  Extension (15)    Abduction (40) WNL 20*  Adduction     Internal Rotation (45) 30 15*  External Rotation (45) 10 5*      Knee    Flexion (135) WNL WNL  Extension (0) WNL WNL  (* = pain; Blank rows = not tested)   LE MMT:  MMT (out of 5) Right 05/17/2022 Left 05/17/2022  Hip flexion 5 3  Hip extension 5 5  Hip abduction 5 3+*  Hip adduction 5 4*  Hip internal rotation 5 5*  Hip external rotation 5 5*  Knee flexion 5 5  Knee extension 5 5  (* = pain; Blank rows = not tested)   Sensation Grossly intact to light touch bilateral LEs as determined by testing dermatomes L2-S2. Proprioception, and hot/cold testing deferred on this date.  Muscle Length Hamstrings: R: Negative L: Negative  Functional Tasks Lifting: Deep squat: Sit to stand: Grossly WFL; R bias Forward Step-Down Test: R:  L:  Lateral Step-Down Test: R:  L:      TODAY'S TREATMENT  Access Code: [email or web address]   PATIENT EDUCATION:  Education details: POC, prognosis Person educated: Patient Education method: Explanation, Demonstration, and Handouts Education comprehension: verbalized understanding and returned demonstration   HOME EXERCISE PROGRAM: Access Code: [email or web address]   ASSESSMENT:  CLINICAL IMPRESSION: Patient is a 76  y.o. who was seen today for physical therapy evaluation and treatment in a post-operative state for treatment of sacral and pelvic fractures. Objective impairments include  Abnormal gait, decreased activity tolerance, decreased balance, decreased coordination, decreased endurance, difficulty walking, decreased ROM, decreased strength, improper body mechanics, and pain. These impairments are limiting patient from cleaning, laundry, shopping, community activity, occupation, and yard work. Personal factors including Age, Behavior pattern, Past/current experiences, Time since onset of injury/illness/exacerbation, and 3+ comorbidities: osteoporosis, tobacco use, CAD  are also affecting patient's functional outcome. Patient will benefit from skilled PT to address above impairments and improve overall function.  REHAB POTENTIAL: Good  CLINICAL DECISION MAKING: Evolving/moderate complexity  EVALUATION COMPLEXITY: Moderate   GOALS: Goals reviewed with patient? Yes  SHORT TERM GOALS: Target date: 06/14/2022  Pt will be independent with HEP to improve strength and decrease back pain to improve pain-free function at home and work. Baseline: initiated Goal status: INITIAL   LONG TERM GOALS: Target date: 07/12/2022  Patient will increase FOTO to at least 64 to demonstrate significant improvement in function at home and work related to chief complaint.  Baseline: 48 Goal status: INITIAL  2.  Patient will decrease worst pain by at least 2 points on the NPRS in order to demonstrate clinically significant reduction in back pain. Baseline:  Goal status: INITIAL  3.  Patient will increase gross strength of L hip by at least 1/2 grade on MMT in order to participate fully in activities at home and in the community. Baseline: 3+/5 and painful Goal status: INITIAL  4.  Patient will increase 6MWT by at least 2m(1630f in order to demonstrate clinically significant improvement in cardiopulmonary endurance and  community ambulation. Baseline: not formally assessed  Goal status: INITIAL   PLAN: PT FREQUENCY: 2x/week  PT DURATION: 8 weeks  PLANNED INTERVENTIONS: Therapeutic exercises, Therapeutic activity, Neuromuscular re-education, Balance training, Gait training, Patient/Family education, Joint manipulation, Joint mobilization, Canalith repositioning, Aquatic Therapy, Dry Needling, Cognitive remediation, Electrical stimulation, Spinal manipulation, Spinal mobilization, Cryotherapy, Moist heat, Traction, Ultrasound, Ionotophoresis '4mg'$ /ml Dexamethasone, and Manual therapy  PLAN FOR NEXT SESSION: 6MWT, WBAT, mobility phase    KaMyles GipT, DPT #[id number]/24/2023, 4:34 PM

## 2022-05-22 ENCOUNTER — Encounter: Payer: Self-pay | Admitting: Physical Therapy

## 2022-05-22 ENCOUNTER — Ambulatory Visit: Payer: Medicare Other | Admitting: Physical Therapy

## 2022-05-22 DIAGNOSIS — R102 Pelvic and perineal pain: Secondary | ICD-10-CM

## 2022-05-22 DIAGNOSIS — R262 Difficulty in walking, not elsewhere classified: Secondary | ICD-10-CM

## 2022-05-22 DIAGNOSIS — Z9181 History of falling: Secondary | ICD-10-CM

## 2022-05-22 NOTE — Therapy (Signed)
OUTPATIENT PHYSICAL THERAPY TREATMENT   Patient Name: FENNA SEMEL MRN: [id number] DOB:1946/03/07, 76 y.o., female Today's Date: 05/22/2022   PT End of Session - 05/22/22 1119     Visit Number 2    Number of Visits 17    Date for PT Re-Evaluation 07/12/22    PT Start Time 1115    PT Stop Time 1155    PT Time Calculation (min) 40 min    Equipment Utilized During Treatment Gait belt    Activity Tolerance Patient tolerated treatment well    Behavior During Therapy WFL for tasks assessed/performed             Past Medical History:  Diagnosis Date   Cancer ([place])    basal cell   Osteoporosis    Varicose veins    Past Surgical History:  Procedure Laterality Date   ABDOMINAL HYSTERECTOMY     BASAL CELL CARCINOMA EXCISION Left    leg   CATARACT EXTRACTION W/ INTRAOCULAR LENS  IMPLANT, BILATERAL     CHOLECYSTECTOMY     DILATION AND CURETTAGE OF UTERUS     ENDOSCOPIC LIGATION SAPHENOUS VEIN Left    TRIGGER FINGER RELEASE Left 07/21/2015   Procedure: RELEASE TRIGGER FINGER/A-1 PULLEY;  Surgeon: Corky Mull, MD;  Location: [place];  Service: Orthopedics;  Laterality: Left;   TUBAL LIGATION     There are no problems to display for this patient.   PCP: Gayland Curry, MD  REFERRING PROVIDER: Enzo Bi*  REFERRING DIAGNOSIS: S32.519A (ICD-10-CM) - Fracture of superior rim of unspecified pubis, initial encounter for closed fracture   THERAPY DIAG: Difficulty in walking, not elsewhere classified  Pelvic pain  History of falling  RATIONALE FOR EVALUATION AND TREATMENT: Rehabilitation  ONSET DATE: 04/26/2022 (surgical date)  FOLLOW UP APPT WITH PROVIDER: Yes   Initial Evaluation SUBJECTIVE:                                                                                                                                                                                         Chief Complaint: Patient returns to clinic after surgical repair  of fractures of the sacrum and in the context of continued slow healing fractures of L pubic ramus. Able to ambulate without RW for household distances. Notes decreased tolerance to activity, pain at sacrum with and without hip movement. Patient is WBAT and denies any specific concerns.   Pertinent History percutaneous skeletal fixation of bilateral sacral fractures by Dr. Lennart Pall on 04/26/2022 that was sustained after a fall. Originally it was treated non-operatively, by the patient has since developed significant kyphotic deformity of the sacrum leading to  significant pain and impaired ambulation. Date of injury is 01/26/2022.   Pain:  Pain Intensity: Present: /10, Best: /10, Worst: /10 Pain location: L groin, B sacrum Pain Quality: sharp, burning, and aching  Radiating: Yes  Numbness/Tingling: No Focal Weakness: No Aggravating factors: hip abduction, rotation, flexion; increased walking, walking on uneven ground Relieving factors: rest, medication  History of prior back injury, pain, surgery, or therapy: Yes Falls: Has patient fallen in last 6 months? Yes, Number of falls: 1 Follow-up appointment with MD: Yes  Imaging: Yes  Prior level of function: Independent  Red flags (bowel/bladder changes, saddle paresthesia, personal history of cancer, h/o spinal tumors, h/o compression fx, h/o abdominal aneurysm, abdominal pain, chills/fever, night sweats, nausea, vomiting, unrelenting pain, first onset of insidious LBP <20 y/o): Negative  Precautions: None  Weight Bearing Restrictions: WBAT   OBJECTIVE:   Patient Surveys  FOTO 48; predicted 78   Cognition Patient is oriented to person, place, and time.  Recent memory is intact.  Remote memory is intact.  Attention span and concentration are intact.  Expressive speech is intact.  Patient's fund of knowledge is within normal limits for educational level.     Gross Musculoskeletal Assessment Tremor: None Bulk: Normal Tone: Normal No  visible step-off along spinal column, no signs of scoliosis   GAIT: Patient ambulates with decreased stance time on LLE and elevated L IC. Patient also has limited load acceptance on LLE, but shows no signs of significant unsteadiness when ambulating without AD.    Posture: Lumbar lordosis: WNL Iliac crest height: L elevated Lumbar lateral shift: Negative Lower crossed syndrome (tight hip flexors and erector spinae; weak gluts and abs): Negative   AROM  AROM (Normal range in degrees) AROM  05/22/2022  Lumbar   Flexion (65)   Extension (30)   Right lateral flexion (25)   Left lateral flexion (25)   Right rotation (30)   Left rotation (30)       Hip Right Left  Flexion (125) WNL WNL*  Extension (15)    Abduction (40) WNL 20*  Adduction     Internal Rotation (45) 30 15*  External Rotation (45) 10 5*      Knee    Flexion (135) WNL WNL  Extension (0) WNL WNL  (* = pain; Blank rows = not tested)   LE MMT:  MMT (out of 5) Right 05/22/2022 Left 05/22/2022  Hip flexion 5 3  Hip extension 5 5  Hip abduction 5 3+*  Hip adduction 5 4*  Hip internal rotation 5 5*  Hip external rotation 5 5*  Knee flexion 5 5  Knee extension 5 5  (* = pain; Blank rows = not tested)   Sensation Grossly intact to light touch bilateral LEs as determined by testing dermatomes L2-S2. Proprioception, and hot/cold testing deferred on this date.  Muscle Length Hamstrings: R: Negative L: Negative  Functional Tasks Lifting: Deep squat: Sit to stand: Grossly WFL; R bias Forward Step-Down Test: R:  L:  Lateral Step-Down Test: R:  L:    05/22/2022 TREATMENT SUBJECTIVE: Patient states that she is feeling okay but in pain today. Unsure if it is related to weather/humidity.  PAIN: 4-5/10 Pre-treatment assessment:  Manual Therapy:   Neuromuscular Re-education: Reactive balance: Medball toss/catch, airex, WBOS and NBOS, x12 each SLS with fingertip support, B, 2x 30 sec  Therapeutic  Exercise: Sci[employer] seated elliptical, 5 min 6" stair taps, x 1 min alternating 6" lateral step down, x12 each Hip 4 way,  3# CW, 2x12 each Hamstring stretch Adductor stretch  Treatments unbilled:  Post-treatment assessment:  Patient educated throughout session on appropriate technique and form using multi-modal cueing, HEP, and activity modification. Patient articulated understanding and returned demonstration.  Patient Response to interventions: Denies increased pain    HOME EXERCISE PROGRAM: Access Code: [email or web address]   ASSESSMENT:  CLINICAL IMPRESSION: Patient presents to clinic with excellent motivation to participate in therapy. Patient demonstrates deficits in activity tolerance, posture, gait, and BLE strength. Patient able to achieve standing hip strengthening with resistance without significant compensations or significant increase in pain during today's session and responded positively to reactive balance interventions. Patient will benefit from continued skilled therapeutic intervention to address remaining deficits in activity tolerance, posture, gait, and BLE strength in order to increase function and improve overall QOL.    Objective impairments include Abnormal gait, decreased activity tolerance, decreased balance, decreased coordination, decreased endurance, difficulty walking, decreased ROM, decreased strength, improper body mechanics, and pain. These impairments are limiting patient from cleaning, laundry, shopping, community activity, occupation, and yard work. Personal factors including Age, Behavior pattern, Past/current experiences, Time since onset of injury/illness/exacerbation, and 3+ comorbidities: osteoporosis, tobacco use, CAD  are also affecting patient's functional outcome. Patient will benefit from skilled PT to address above impairments and improve overall function.  REHAB POTENTIAL: Good  CLINICAL DECISION MAKING: Evolving/moderate complexity  EVALUATION  COMPLEXITY: Moderate   GOALS: Goals reviewed with patient? Yes  SHORT TERM GOALS: Target date: 06/14/2022  Pt will be independent with HEP to improve strength and decrease back pain to improve pain-free function at home and work. Baseline: initiated Goal status: INITIAL   LONG TERM GOALS: Target date: 07/12/2022  Patient will increase FOTO to at least 64 to demonstrate significant improvement in function at home and work related to chief complaint.  Baseline: 48 Goal status: INITIAL  2.  Patient will decrease worst pain by at least 2 points on the NPRS in order to demonstrate clinically significant reduction in back pain. Baseline:  Goal status: INITIAL  3.  Patient will increase gross strength of L hip by at least 1/2 grade on MMT in order to participate fully in activities at home and in the community. Baseline: 3+/5 and painful Goal status: INITIAL  4.  Patient will increase 6MWT by at least 55m(1685f in order to demonstrate clinically significant improvement in cardiopulmonary endurance and community ambulation. Baseline: not formally assessed  Goal status: INITIAL   PLAN: PT FREQUENCY: 2x/week  PT DURATION: 8 weeks  PLANNED INTERVENTIONS: Therapeutic exercises, Therapeutic activity, Neuromuscular re-education, Balance training, Gait training, Patient/Family education, Joint manipulation, Joint mobilization, Canalith repositioning, Aquatic Therapy, Dry Needling, Cognitive remediation, Electrical stimulation, Spinal manipulation, Spinal mobilization, Cryotherapy, Moist heat, Traction, Ultrasound, Ionotophoresis '4mg'$ /ml Dexamethasone, and Manual therapy  PLAN FOR NEXT SESSION: 6MWT, WBAT, mobility phase    KaMyles GipT, DPT #[id number]/29/2023, 11:19 AM

## 2022-05-25 ENCOUNTER — Ambulatory Visit: Payer: Medicare Other | Admitting: Physical Therapy

## 2022-05-29 ENCOUNTER — Ambulatory Visit: Payer: Medicare Other | Admitting: Physical Therapy

## 2022-05-31 ENCOUNTER — Ambulatory Visit: Payer: Medicare Other | Admitting: Physical Therapy

## 2022-06-05 ENCOUNTER — Ambulatory Visit: Payer: Medicare Other | Attending: Orthopedic Surgery | Admitting: Physical Therapy

## 2022-06-07 ENCOUNTER — Ambulatory Visit: Payer: Medicare Other | Admitting: Physical Therapy

## 2022-06-12 ENCOUNTER — Ambulatory Visit: Payer: Medicare Other | Admitting: Physical Therapy

## 2022-06-12 ENCOUNTER — Encounter: Payer: Medicare Other | Admitting: Physical Therapy

## 2022-06-14 ENCOUNTER — Encounter: Payer: Commercial Managed Care - PPO | Admitting: Physical Therapy

## 2022-06-19 ENCOUNTER — Encounter: Payer: Commercial Managed Care - PPO | Admitting: Physical Therapy

## 2022-06-21 ENCOUNTER — Encounter: Payer: Commercial Managed Care - PPO | Admitting: Physical Therapy

## 2022-06-28 ENCOUNTER — Ambulatory Visit
Admission: RE | Admit: 2022-06-28 | Discharge: 2022-06-28 | Disposition: A | Discharge status: Home / Self Care | Payer: Medicare Other | Source: Ambulatory Visit | Attending: Family Medicine | Admitting: Family Medicine

## 2022-06-28 DIAGNOSIS — M8000XD Age-related osteoporosis with current pathological fracture, unspecified site, subsequent encounter for fracture with routine healing: Secondary | ICD-10-CM | POA: Insufficient documentation

## 2022-06-28 DIAGNOSIS — S32511D Fracture of superior rim of right pubis, subsequent encounter for fracture with routine healing: Secondary | ICD-10-CM | POA: Diagnosis present

## 2022-06-28 DIAGNOSIS — Z780 Asymptomatic menopausal state: Secondary | ICD-10-CM | POA: Insufficient documentation

## 2022-09-03 ENCOUNTER — Other Ambulatory Visit: Payer: Self-pay | Admitting: Family Medicine

## 2022-09-03 DIAGNOSIS — Z1231 Encounter for screening mammogram for malignant neoplasm of breast: Secondary | ICD-10-CM

## 2022-10-03 ENCOUNTER — Ambulatory Visit
Admission: RE | Admit: 2022-10-03 | Discharge: 2022-10-03 | Disposition: A | Discharge status: Home / Self Care | Payer: Medicare Other | Source: Ambulatory Visit | Attending: Family Medicine | Admitting: Family Medicine

## 2022-10-03 DIAGNOSIS — Z1231 Encounter for screening mammogram for malignant neoplasm of breast: Secondary | ICD-10-CM | POA: Diagnosis not present

## 2023-09-23 ENCOUNTER — Other Ambulatory Visit: Payer: Self-pay | Admitting: Family Medicine

## 2023-09-23 DIAGNOSIS — Z1231 Encounter for screening mammogram for malignant neoplasm of breast: Secondary | ICD-10-CM

## 2023-10-08 ENCOUNTER — Other Ambulatory Visit: Payer: Self-pay | Admitting: "Endocrinology

## 2023-10-08 DIAGNOSIS — M810 Age-related osteoporosis without current pathological fracture: Secondary | ICD-10-CM

## 2023-10-24 ENCOUNTER — Ambulatory Visit
Admission: RE | Admit: 2023-10-24 | Discharge: 2023-10-24 | Disposition: A | Discharge status: Home / Self Care | Payer: Medicare Other | Source: Ambulatory Visit | Attending: "Endocrinology | Admitting: "Endocrinology

## 2023-10-24 ENCOUNTER — Ambulatory Visit
Admission: RE | Admit: 2023-10-24 | Discharge: 2023-10-24 | Disposition: A | Discharge status: Home / Self Care | Payer: Medicare Other | Source: Ambulatory Visit | Attending: Family Medicine | Admitting: Family Medicine

## 2023-10-24 DIAGNOSIS — M810 Age-related osteoporosis without current pathological fracture: Secondary | ICD-10-CM

## 2023-10-24 DIAGNOSIS — Z1231 Encounter for screening mammogram for malignant neoplasm of breast: Secondary | ICD-10-CM | POA: Insufficient documentation

## 2024-02-11 ENCOUNTER — Ambulatory Visit
Admission: RE | Admit: 2024-02-11 | Discharge: 2024-02-11 | Disposition: A | Discharge status: Home / Self Care | Source: Ambulatory Visit | Attending: Family Medicine | Admitting: Family Medicine

## 2024-02-11 ENCOUNTER — Ambulatory Visit
Admission: RE | Admit: 2024-02-11 | Discharge: 2024-02-11 | Disposition: A | Discharge status: Home / Self Care | Attending: Family Medicine | Admitting: Family Medicine

## 2024-02-11 ENCOUNTER — Other Ambulatory Visit: Payer: Self-pay | Admitting: Family Medicine

## 2024-02-11 DIAGNOSIS — M2550 Pain in unspecified joint: Secondary | ICD-10-CM
